# Patient Record
Sex: Female | Born: 1970 | Race: Black or African American | Hispanic: No | Marital: Single | Smoking: Current every day smoker
Health system: Southern US, Community
[De-identification: ages and names within clinical notes are randomized; demographics above are authoritative.]

## PROBLEM LIST (undated history)

## (undated) HISTORY — PX: OTHER SURGICAL HISTORY: SHX169

---

## 1997-09-18 ENCOUNTER — Ambulatory Visit (HOSPITAL_COMMUNITY): Admission: RE | Admit: 1997-09-18 | Discharge: 1997-09-18 | Payer: Self-pay | Admitting: Obstetrics & Gynecology

## 1997-12-07 ENCOUNTER — Ambulatory Visit (HOSPITAL_COMMUNITY): Admission: RE | Admit: 1997-12-07 | Discharge: 1997-12-07 | Payer: Self-pay | Admitting: Obstetrics

## 1998-01-15 ENCOUNTER — Encounter (HOSPITAL_COMMUNITY): Admission: RE | Admit: 1998-01-15 | Discharge: 1998-01-25 | Payer: Self-pay | Admitting: Obstetrics

## 1998-01-22 ENCOUNTER — Inpatient Hospital Stay (HOSPITAL_COMMUNITY): Admission: AD | Admit: 1998-01-22 | Discharge: 1998-01-26 | Payer: Self-pay | Admitting: *Deleted

## 1998-12-27 ENCOUNTER — Ambulatory Visit (HOSPITAL_COMMUNITY): Admission: RE | Admit: 1998-12-27 | Discharge: 1998-12-27 | Payer: Self-pay | Admitting: *Deleted

## 1998-12-28 ENCOUNTER — Ambulatory Visit (HOSPITAL_COMMUNITY): Admission: RE | Admit: 1998-12-28 | Discharge: 1998-12-28 | Payer: Self-pay | Admitting: *Deleted

## 1999-01-11 ENCOUNTER — Ambulatory Visit (HOSPITAL_COMMUNITY): Admission: RE | Admit: 1999-01-11 | Discharge: 1999-01-11 | Payer: Self-pay | Admitting: *Deleted

## 1999-01-25 ENCOUNTER — Encounter: Admission: RE | Admit: 1999-01-25 | Discharge: 1999-01-25 | Payer: Self-pay | Admitting: Pediatrics

## 1999-02-28 ENCOUNTER — Inpatient Hospital Stay (HOSPITAL_COMMUNITY): Admission: AD | Admit: 1999-02-28 | Discharge: 1999-02-28 | Payer: Self-pay | Admitting: *Deleted

## 1999-03-03 ENCOUNTER — Encounter (HOSPITAL_COMMUNITY): Admission: RE | Admit: 1999-03-03 | Discharge: 1999-03-04 | Payer: Self-pay | Admitting: Obstetrics & Gynecology

## 1999-03-03 ENCOUNTER — Encounter (INDEPENDENT_AMBULATORY_CARE_PROVIDER_SITE_OTHER): Payer: Self-pay

## 1999-03-03 ENCOUNTER — Inpatient Hospital Stay (HOSPITAL_COMMUNITY): Admission: AD | Admit: 1999-03-03 | Discharge: 1999-03-07 | Payer: Self-pay | Admitting: Obstetrics & Gynecology

## 1999-07-04 ENCOUNTER — Emergency Department (HOSPITAL_COMMUNITY): Admission: EM | Admit: 1999-07-04 | Discharge: 1999-07-05 | Payer: Self-pay | Admitting: Emergency Medicine

## 2000-12-31 ENCOUNTER — Encounter: Admission: RE | Admit: 2000-12-31 | Discharge: 2000-12-31 | Payer: Self-pay | Admitting: *Deleted

## 2004-11-14 ENCOUNTER — Emergency Department (HOSPITAL_COMMUNITY): Admission: EM | Admit: 2004-11-14 | Discharge: 2004-11-14 | Payer: Self-pay | Admitting: Emergency Medicine

## 2007-11-22 ENCOUNTER — Ambulatory Visit (HOSPITAL_COMMUNITY): Admission: RE | Admit: 2007-11-22 | Discharge: 2007-11-22 | Payer: Self-pay | Admitting: Obstetrics

## 2010-07-15 NOTE — Op Note (Signed)
[facility]  Patient:    Mandy Taylor                         MRN: [id number] Proc. Date: 03/05/99 Adm. Date:  [id number] Attending:  Antionette Char                           Operative Report  PREOPERATIVE DIAGNOSIS:       Desires sterilization.  POSTOPERATIVE DIAGNOSIS:      Desires sterilization.  OPERATION:                    Bilateral partial salpingectomy ([place] technique).  SURGEON:                      Charles A. Clearance Coots, M.D.  ASSISTANT:  ANESTHESIA:                   Epidural.  ESTIMATED BLOOD LOSS:         Negligible.  COMPLICATIONS:                None.  SPECIMEN:                     Approximately 2 cm segments of right and left fallopian tubes.  DESCRIPTION OF PROCEDURE:     The patient was brought to the operating room and  after satisfactory redosing of the epidural, the abdomen was prepped and draped in the usual sterile fashion.  A small inferior umbilical incision was made with the scalpel that was deepened down to the fascia with curved Mayo scissors bluntly.  The fascia was grasped in the midline with Kelly forceps and was cut transversely with curved Mayo scissors in between the [place] forceps.  The peritoneum was also cut.  The incision was extended to the left and to the right with the curved Mayo scissors.  Right angle retractors were placed in the incision and the right fallopian tube was identified and grasped with a Babcock clamp.  The tube was followed from the cornual end to the fimbrial end serially in the grasp of Babcock clamps.  The tube was then followed in a retrograde fashion back to the isthmic  area of the tube with the Babcock clamps and a knuckle of tube beneath the Bacock clamp and then the isthmic area of the tube was doubly ligated with #1 plain catgut and the section of tube above the knot was excised with Metzenbaum scissors and  submitted to pathology for evaluation.  There was  no active bleeding from the tubal stumps and they were therefore placed back in their normal anatomic position. he same procedure was performed on the opposite side without complications.  The abdomen was then closed as follows.  The peritoneum and fascia was closed as one with a continuous suture of 2-0 Vicryl.  The subcutaneous tissue was approximated with an interrupted suture of 2-0 Vicryl.  The skin was closed with a continuous subcuticular suture of 4-0 Monocryl.  A sterile bandage was applied to the incision closure.  The surgical technician indicated that all sponge, needle, and instrument counts were correct.  The patient tolerated the procedure well and was transported to the recovery room in satisfactory condition. DD:  03/05/99 TD:  03/05/99 Job: [id number]

## 2011-08-14 ENCOUNTER — Emergency Department (HOSPITAL_COMMUNITY)
Admission: EM | Admit: 2011-08-14 | Discharge: 2011-08-14 | Disposition: A | Discharge status: Home / Self Care | Payer: Self-pay | Attending: Emergency Medicine | Admitting: Emergency Medicine

## 2011-08-14 ENCOUNTER — Encounter (HOSPITAL_COMMUNITY): Payer: Self-pay | Admitting: Emergency Medicine

## 2011-08-14 DIAGNOSIS — F17200 Nicotine dependence, unspecified, uncomplicated: Secondary | ICD-10-CM | POA: Insufficient documentation

## 2011-08-14 DIAGNOSIS — R21 Rash and other nonspecific skin eruption: Secondary | ICD-10-CM

## 2011-08-14 DIAGNOSIS — L509 Urticaria, unspecified: Secondary | ICD-10-CM | POA: Insufficient documentation

## 2011-08-14 MED ORDER — HYDROCORTISONE 2.5 % EX LOTN: TOPICAL_LOTION | Freq: Two times a day (BID) | CUTANEOUS | Status: AC

## 2011-08-14 MED ORDER — HYDROXYZINE HCL 25 MG PO TABS: 25.0000 mg | ORAL_TABLET | Freq: Four times a day (QID) | ORAL | Status: AC

## 2011-08-14 NOTE — ED Provider Notes (Signed)
History     CSN: [id number]  Arrival date & time 08/14/11  1416   First MD Initiated Contact with Patient 08/14/11 1650      5:12 PM HPI F reports one week ago and stayed in a hotel room. States afterwards developed large hives on her back. States symptoms have been persistent. Reports hives or itchy and red. States significant other has been in the same bed but he does not have similar hives. Denies purulent drainage, change in detergents, soap, lotion, or known allergies.  Patient is a 41 y.o. female presenting with rash. The history is provided by the patient.  Rash  This is a new problem. The current episode started more than 1 week ago. The problem has been gradually worsening. The problem is associated with an unknown factor. There has been no fever. The rash is present on the back, torso, right lower leg, right upper leg, left upper leg and left lower leg. The pain is mild. The pain has been constant since onset. Associated symptoms include itching. She has tried antihistamines and anti-itch cream for the symptoms. The treatment provided no relief.    History reviewed. No pertinent past medical history.  Past Surgical History  Procedure Date  . Cesearean     No family history on file.  History  Substance Use Topics  . Smoking status: Current Everyday Smoker    Types: Cigarettes  . Smokeless tobacco: Not on file  . Alcohol Use: 0.6 oz/week    1 Cans of beer per week     every 2 days    OB History    Grav Para Term Preterm Abortions TAB SAB Ect Mult Living                  Review of Systems  Constitutional: Negative for fever and chills.  HENT: Negative for rhinorrhea.   Eyes: Negative for redness.  Respiratory: Negative for cough, shortness of breath and wheezing.   Cardiovascular: Negative for chest pain and palpitations.  Gastrointestinal: Negative for nausea.  Skin: Positive for itching and rash.       Pruritus  All other systems reviewed and are  negative.    Allergies  Review of patient's allergies indicates no known allergies.  Home Medications  No current outpatient prescriptions on file.  BP 109/79  Pulse 102  Temp 98.2 F (36.8 C) (Oral)  Resp 18  SpO2 100%  LMP 08/07/2011  Physical Exam  Vitals reviewed. Constitutional: She is oriented to person, place, and time. Vital signs are normal. She appears well-developed and well-nourished. No distress.  HENT:  Head: Normocephalic and atraumatic.  Eyes: Pupils are equal, round, and reactive to light.  Neck: Neck supple.  Pulmonary/Chest: Effort normal.  Neurological: She is alert and oriented to person, place, and time.  Skin: Skin is warm and dry. No rash noted. No erythema. No pallor.       Patient has a large urticaria on right upper extremity in right ankle. Areas on back in that appears to be similar with decreased erythema and appear to be excoriated. No fluctuance, or drainage.  Psychiatric: She has a normal mood and affect. Her behavior is normal.    ED Course  Procedures   MDM   Patient appears to have lesions typical for did drugs. However the story is inconsistent since the significant other does not have similar symptoms. Will prescribe patient prednisone cream and antihistamine. Advised followup with dermatology if no improvement after medication. Patient voices  understanding and is ready for discharge   Thomasene Lot, Cordelia Poche 08/14/11 1719

## 2011-08-14 NOTE — Discharge Instructions (Signed)
Rash A rash is a change in the color or texture of your skin. There are many different types of rashes. You may have other problems that accompany your rash. CAUSES   Infections.   Allergic reactions. This can include allergies to pets or foods.   Certain medicines.   Exposure to certain chemicals, soaps, or cosmetics.   Heat.   Exposure to poisonous plants.   Tumors, both cancerous and noncancerous.  SYMPTOMS   Redness.   Scaly skin.   Itchy skin.   Dry or cracked skin.   Bumps.   Blisters.   Pain.  DIAGNOSIS  Your caregiver may do a physical exam to determine what type of rash you have. A skin sample (biopsy) may be taken and examined under a microscope. TREATMENT  Treatment depends on the type of rash you have. Your caregiver may prescribe certain medicines. For serious conditions, you may need to see a skin doctor (dermatologist). HOME CARE INSTRUCTIONS   Avoid the substance that caused your rash.   Do not scratch your rash. This can cause infection.   You may take cool baths to help stop itching.   Only take over-the-counter or prescription medicines as directed by your caregiver.   Keep all follow-up appointments as directed by your caregiver.  SEEK IMMEDIATE MEDICAL CARE IF:  You have increasing pain, swelling, or redness.   You have a fever.   You have new or severe symptoms.   You have body aches, diarrhea, or vomiting.   Your rash is not better after 3 days.  MAKE SURE YOU:  Understand these instructions.   Will watch your condition.   Will get help right away if you are not doing well or get worse.  Document Released: 02/03/2002 Document Revised: 02/02/2011 Document Reviewed: 11/28/2010 Exit[employer] Patient Information 2012 [employer]. 

## 2011-08-14 NOTE — ED Notes (Signed)
Pt presenting to ed with c/o rash to extremities that started a week ago. Pt is alert and oriented at this time

## 2011-08-15 NOTE — ED Provider Notes (Signed)
Medical screening examination/treatment/procedure(s) were performed by non-physician practitioner and as supervising physician I was immediately available for consultation/collaboration.   Kregg Cihlar M Kaylise Blakeley, MD 08/15/11 2300 

## 2014-01-01 ENCOUNTER — Emergency Department (HOSPITAL_COMMUNITY): Payer: Self-pay

## 2014-01-01 ENCOUNTER — Emergency Department (HOSPITAL_COMMUNITY)
Admission: EM | Admit: 2014-01-01 | Discharge: 2014-01-01 | Disposition: A | Discharge status: Home / Self Care | Payer: Self-pay | Attending: Emergency Medicine | Admitting: Emergency Medicine

## 2014-01-01 ENCOUNTER — Encounter (HOSPITAL_COMMUNITY): Payer: Self-pay | Admitting: Emergency Medicine

## 2014-01-01 DIAGNOSIS — R52 Pain, unspecified: Secondary | ICD-10-CM

## 2014-01-01 DIAGNOSIS — Y93F2 Activity, caregiving, lifting: Secondary | ICD-10-CM | POA: Insufficient documentation

## 2014-01-01 DIAGNOSIS — Y9289 Other specified places as the place of occurrence of the external cause: Secondary | ICD-10-CM | POA: Insufficient documentation

## 2014-01-01 DIAGNOSIS — Z720 Tobacco use: Secondary | ICD-10-CM | POA: Insufficient documentation

## 2014-01-01 DIAGNOSIS — S6991XA Unspecified injury of right wrist, hand and finger(s), initial encounter: Secondary | ICD-10-CM | POA: Insufficient documentation

## 2014-01-01 DIAGNOSIS — X58XXXA Exposure to other specified factors, initial encounter: Secondary | ICD-10-CM | POA: Insufficient documentation

## 2014-01-01 MED ORDER — TRAMADOL HCL 50 MG PO TABS
50.0000 mg | ORAL_TABLET | Freq: Once | ORAL | Status: AC
  Administered 2014-01-01: 50 mg via ORAL
  Filled 2014-01-01: qty 1

## 2014-01-01 MED ORDER — TRAMADOL HCL 50 MG PO TABS: 50.0000 mg | ORAL_TABLET | Freq: Four times a day (QID) | ORAL | Status: DC | PRN
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
## 2014-01-01 NOTE — ED Notes (Signed)
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 No obvious deformity. Did not treat with OTC meds or ice

## 2014-01-01 NOTE — Progress Notes (Signed)
[facility] Stacy,  ° °Provided pt with a list of primary care resources and a [facility] [employer] Card application to help patient establish primary care.  °

## 2014-06-09 ENCOUNTER — Emergency Department (HOSPITAL_COMMUNITY): Payer: Self-pay

## 2014-06-09 ENCOUNTER — Encounter (HOSPITAL_COMMUNITY): Payer: Self-pay | Admitting: Emergency Medicine

## 2014-06-09 ENCOUNTER — Emergency Department (HOSPITAL_COMMUNITY)
Admission: EM | Admit: 2014-06-09 | Discharge: 2014-06-09 | Disposition: A | Discharge status: Home / Self Care | Payer: Self-pay | Attending: Emergency Medicine | Admitting: Emergency Medicine

## 2014-06-09 DIAGNOSIS — Y998 Other external cause status: Secondary | ICD-10-CM | POA: Insufficient documentation

## 2014-06-09 DIAGNOSIS — Y9389 Activity, other specified: Secondary | ICD-10-CM | POA: Insufficient documentation

## 2014-06-09 DIAGNOSIS — Y9289 Other specified places as the place of occurrence of the external cause: Secondary | ICD-10-CM | POA: Insufficient documentation

## 2014-06-09 DIAGNOSIS — Z720 Tobacco use: Secondary | ICD-10-CM | POA: Insufficient documentation

## 2014-06-09 DIAGNOSIS — T148XXA Other injury of unspecified body region, initial encounter: Secondary | ICD-10-CM

## 2014-06-09 DIAGNOSIS — X58XXXA Exposure to other specified factors, initial encounter: Secondary | ICD-10-CM | POA: Insufficient documentation

## 2014-06-09 DIAGNOSIS — S29012A Strain of muscle and tendon of back wall of thorax, initial encounter: Secondary | ICD-10-CM | POA: Insufficient documentation

## 2014-06-09 LAB — BASIC METABOLIC PANEL
Anion gap: 9 (ref 5–15)
BUN: 6 mg/dL (ref 6–23)
CALCIUM: 9.1 mg/dL (ref 8.4–10.5)
CHLORIDE: 104 mmol/L (ref 96–112)
CO2: 25 mmol/L (ref 19–32)
Creatinine, Ser: 0.79 mg/dL (ref 0.50–1.10)
GFR calc Af Amer: >90 mL/min (ref >90)
GFR calc non Af Amer: >90 mL/min (ref >90)
Glucose, Bld: 91 mg/dL (ref 70–99)
Potassium: 3.5 mmol/L (ref 3.5–5.1)
SODIUM: 138 mmol/L (ref 135–145)

## 2014-06-09 LAB — CBC WITH DIFFERENTIAL/PLATELET
Basophils Absolute: 0 10*3/uL (ref 0.0–0.1)
Basophils Relative: 0 % (ref 0–1)
Eosinophils Absolute: 0.1 10*3/uL (ref 0.0–0.7)
Eosinophils Relative: 1 % (ref 0–5)
HCT: 28.2 % — ABNORMAL LOW (ref 36.0–46.0)
Hemoglobin: 7.8 g/dL — ABNORMAL LOW (ref 12.0–15.0)
LYMPHS PCT: 20 % (ref 12–46)
Lymphs Abs: 1.4 10*3/uL (ref 0.7–4.0)
MCH: 17.5 pg — ABNORMAL LOW (ref 26.0–34.0)
MCHC: 27.7 g/dL — ABNORMAL LOW (ref 30.0–36.0)
MCV: 63.4 fL — AB (ref 78.0–100.0)
MONOS PCT: 10 % (ref 3–12)
Monocytes Absolute: 0.7 10*3/uL (ref 0.1–1.0)
Neutro Abs: 4.8 10*3/uL (ref 1.7–7.7)
Neutrophils Relative %: 69 % (ref 43–77)
PLATELETS: 325 10*3/uL (ref 150–400)
RBC: 4.45 MIL/uL (ref 3.87–5.11)
RDW: 17.7 % — ABNORMAL HIGH (ref 11.5–15.5)
WBC: 7 10*3/uL (ref 4.0–10.5)

## 2014-06-09 LAB — I-STAT TROPONIN, ED: Troponin i, poc: 0 ng/mL (ref 0.00–0.08)

## 2014-06-09 MED ORDER — IBUPROFEN 600 MG PO TABS: 600.0000 mg | ORAL_TABLET | Freq: Four times a day (QID) | ORAL | Status: DC | PRN

## 2014-06-09 MED ORDER — ORPHENADRINE CITRATE 30 MG/ML IJ SOLN
60.0000 mg | Freq: Two times a day (BID) | INTRAMUSCULAR | Status: DC
  Filled 2014-06-09 (×2): qty 2

## 2014-06-09 MED ORDER — CYCLOBENZAPRINE HCL 10 MG PO TABS: 10.0000 mg | ORAL_TABLET | Freq: Two times a day (BID) | ORAL | Status: DC | PRN

## 2014-06-09 MED ORDER — KETOROLAC TROMETHAMINE 60 MG/2ML IM SOLN
60.0000 mg | Freq: Once | INTRAMUSCULAR | Status: AC
  Administered 2014-06-09: 60 mg via INTRAMUSCULAR
  Filled 2014-06-09: qty 2

## 2014-06-09 MED ORDER — CYCLOBENZAPRINE HCL 10 MG PO TABS
5.0000 mg | ORAL_TABLET | Freq: Once | ORAL | Status: AC
  Administered 2014-06-09: 5 mg via ORAL
  Filled 2014-06-09: qty 1

## 2014-06-09 NOTE — ED Notes (Signed)
Pt c/o right sided CP, SOB and chills starting this am; pt unsure if had fever

## 2014-06-09 NOTE — ED Provider Notes (Signed)
CSN: [id number]     Arrival date & time 06/09/14  1406 History   First MD Initiated Contact with Patient 06/09/14 1721     Chief Complaint  Patient presents with  . Chest Pain  . Shortness of Breath     (Consider location/radiation/quality/duration/timing/severity/associated sxs/prior Treatment) HPI  This is a 44 yo female with no pertinent PMH, presenting with mid-lower paraspinal pain, after sleeping upright last night.  This started when she awoke, is located right mid-lower paraspinal area.  It is persistent.  It is throbbing.  No meds have been taken.  It radiates throughout the right paraspinal area.  Triage note indicated dyspnea, but Ms. Poblano states that she is not short of breath.  She just feels worse pain when she moves her trunk in any way, including breathing deeply.  Negative for fever, nausea, vomiting, diaphoresis, leg swelling, cough.  Past Medical History  Diagnosis Date  . NVD (normal vaginal delivery)     x2   Past Surgical History  Procedure Laterality Date  . Cesearean     Family History  Problem Relation Age of Onset  . Diabetes Mother   . Hypertension Mother    History  Substance Use Topics  . Smoking status: Current Every Day Smoker    Types: Cigarettes  . Smokeless tobacco: Not on file  . Alcohol Use: 0.6 oz/week    1 Cans of beer per week     Comment: every 2 days   OB History    No data available     Review of Systems  Constitutional: Negative for fever and chills.  HENT: Negative for facial swelling.   Eyes: Negative for photophobia and pain.  Respiratory: Negative for cough and shortness of breath.   Cardiovascular: Negative for chest pain and leg swelling.  Gastrointestinal: Negative for nausea, vomiting and abdominal pain.  Genitourinary: Negative for dysuria.  Musculoskeletal: Positive for back pain. Negative for arthralgias.  Skin: Negative for rash and wound.  Neurological: Negative for seizures.  Hematological: Negative for  adenopathy.      Allergies  Other  Home Medications   Prior to Admission medications   Medication Sig Start Date End Date Taking? Authorizing Provider  cyclobenzaprine (FLEXERIL) 10 MG tablet Take 1 tablet (10 mg total) by mouth 2 (two) times daily as needed for muscle spasms. 06/09/14   Loma Boston, MD  ibuprofen (ADVIL,MOTRIN) 600 MG tablet Take 1 tablet (600 mg total) by mouth every 6 (six) hours as needed. 06/09/14   Loma Boston, MD  traMADol (ULTRAM) 50 MG tablet Take 1 tablet (50 mg total) by mouth every 6 (six) hours as needed. Patient not taking: Reported on 06/09/2014 01/01/14   Junious Silk, PA-C   BP 106/65 mmHg  Pulse 77  Temp(Src) 98.5 F (36.9 C) (Oral)  Resp 20  SpO2 100%  LMP 05/28/2014 Physical Exam  Constitutional: She is oriented to person, place, and time. She appears well-developed and well-nourished. No distress.  HENT:  Head: Normocephalic and atraumatic.  Mouth/Throat: No oropharyngeal exudate.  Eyes: Conjunctivae are normal. Pupils are equal, round, and reactive to light. No scleral icterus.  Neck: Normal range of motion. No tracheal deviation present. No thyromegaly present.  Cardiovascular: Normal rate, regular rhythm and normal heart sounds.  Exam reveals no gallop and no friction rub.   No murmur heard. Pulmonary/Chest: Effort normal and breath sounds normal. No stridor. No respiratory distress. She has no wheezes. She has no rales. She exhibits no tenderness.  Abdominal: Soft.  She exhibits no distension and no mass. There is no tenderness. There is no rebound and no guarding.  Musculoskeletal: Normal range of motion. She exhibits tenderness (right paraspinal muscular TTP at around T10). She exhibits no edema.  Neurological: She is alert and oriented to person, place, and time.  Skin: Skin is warm and dry. She is not diaphoretic.    ED Course  Procedures (including critical care time) Labs Review Labs Reviewed  CBC WITH  DIFFERENTIAL/PLATELET - Abnormal; Notable for the following:    Hemoglobin 7.8 (*)    HCT 28.2 (*)    MCV 63.4 (*)    MCH 17.5 (*)    MCHC 27.7 (*)    RDW 17.7 (*)    All other components within normal limits  BASIC METABOLIC PANEL  I-STAT TROPOININ, ED    Imaging Review Dg Chest 2 View  06/09/2014   CLINICAL DATA:  Right-sided chest pain radiating into right upper extremity  EXAM: CHEST  2 VIEW  COMPARISON:  None.  FINDINGS: Lungs are clear. Heart size and pulmonary vascularity are normal. No adenopathy. No pneumothorax. No bone lesions.  IMPRESSION: No edema or consolidation.   Electronically Signed   By: Bretta BangWilliam  Woodruff III M.D.   On: 06/09/2014 15:16     EKG Interpretation   Date/Time:  Tuesday June 09 2014 14:16:22 EDT Ventricular Rate:  86 PR Interval:  118 QRS Duration: 84 QT Interval:  350 QTC Calculation: 418 R Axis:   70 Text Interpretation:  Normal sinus rhythm Normal ECG Confirmed by ZAVITZ   MD, JOSHUA ([id number]) on 06/09/2014 6:06:29 PM      MDM   Final diagnoses:  Muscle strain    This is a 44 yo female with no pertinent PMH, presenting with mid-lower paraspinal pain, after sleeping upright last night.  This started when she awoke, is located right mid-lower paraspinal area.  It is persistent.  It is throbbing.  No meds have been taken.  It radiates throughout the right paraspinal area.  Triage note indicated dyspnea, but Ms. Rodrigue states that she is not short of breath.  She just feels worse pain when she moves her trunk in any way, including breathing deeply.  Negative for fever, nausea, vomiting, diaphoresis, leg swelling, cough.  CV, resp, abdominal exams are WNL.  Positive for right-sided mid paraspinal TTP.  Negative for focal neuro deficits.  EKG, CXR are WNL, as is initial lab workup, including troponin.  History and physical exam are not consistent with ACS, PTX, PNA, dissection, PE, renal etiology such as pyelo or thrombosis, or GB etiology.  No evidence  of spinal fx, epidural abscess, disciitis, cauda equina.  Pain is resolved with toradol, muscle relaxant.  This is consistent with MSK pain.  Pt stable for discharge, with Rx for NSAID, muscle relaxant, FU c PCP.  All questions answered.  Return precautions given.  I have discussed case and care has been guided by my attending physician, Dr. Jodi MourningZavitz.  Loma BostonStirling Luara Faye, MD 06/10/14 1036  Blane OharaJoshua Zavitz, MD 06/12/14 [phone]

## 2014-06-09 NOTE — ED Notes (Signed)
Pin pad broken, pt willing to sign.

## 2014-06-09 NOTE — Discharge Instructions (Signed)

## 2017-09-13 ENCOUNTER — Encounter (HOSPITAL_COMMUNITY): Payer: Self-pay | Admitting: Emergency Medicine

## 2017-09-13 ENCOUNTER — Emergency Department (HOSPITAL_COMMUNITY)
Admission: EM | Admit: 2017-09-13 | Discharge: 2017-09-13 | Disposition: A | Discharge status: Home / Self Care | Payer: Self-pay | Attending: Emergency Medicine | Admitting: Emergency Medicine

## 2017-09-13 DIAGNOSIS — Z79899 Other long term (current) drug therapy: Secondary | ICD-10-CM | POA: Insufficient documentation

## 2017-09-13 DIAGNOSIS — D649 Anemia, unspecified: Secondary | ICD-10-CM | POA: Insufficient documentation

## 2017-09-13 DIAGNOSIS — R0602 Shortness of breath: Secondary | ICD-10-CM | POA: Insufficient documentation

## 2017-09-13 DIAGNOSIS — F17210 Nicotine dependence, cigarettes, uncomplicated: Secondary | ICD-10-CM | POA: Insufficient documentation

## 2017-09-13 LAB — COMPREHENSIVE METABOLIC PANEL
ALBUMIN: 3.6 g/dL (ref 3.5–5.0)
ALK PHOS: 51 U/L (ref 38–126)
ALT: 20 U/L (ref 0–44)
AST: 32 U/L (ref 15–41)
Anion gap: 9 (ref 5–15)
BILIRUBIN TOTAL: 0.7 mg/dL (ref 0.3–1.2)
BUN: 8 mg/dL (ref 6–20)
CALCIUM: 8.8 mg/dL — AB (ref 8.9–10.3)
CO2: 26 mmol/L (ref 22–32)
Chloride: 103 mmol/L (ref 98–111)
Creatinine, Ser: 0.71 mg/dL (ref 0.44–1.00)
GFR calc Af Amer: >60 mL/min (ref >60)
GFR calc non Af Amer: >60 mL/min (ref >60)
GLUCOSE: 92 mg/dL (ref 70–99)
Potassium: 3.7 mmol/L (ref 3.5–5.1)
Sodium: 138 mmol/L (ref 135–145)
Total Protein: 7.2 g/dL (ref 6.5–8.1)

## 2017-09-13 LAB — CBC
HCT: 26.7 % — ABNORMAL LOW (ref 36.0–46.0)
Hemoglobin: 7 g/dL — ABNORMAL LOW (ref 12.0–15.0)
MCH: 15.7 pg — ABNORMAL LOW (ref 26.0–34.0)
MCHC: 26.2 g/dL — ABNORMAL LOW (ref 30.0–36.0)
MCV: 60 fL — ABNORMAL LOW (ref 78.0–100.0)
Platelets: 395 10*3/uL (ref 150–400)
RBC: 4.45 MIL/uL (ref 3.87–5.11)
RDW: 19.1 % — AB (ref 11.5–15.5)
WBC: 6.3 10*3/uL (ref 4.0–10.5)

## 2017-09-13 LAB — RAPID URINE DRUG SCREEN, HOSP PERFORMED
AMPHETAMINES: NOT DETECTED
Benzodiazepines: NOT DETECTED
Cocaine: NOT DETECTED
Opiates: NOT DETECTED
TETRAHYDROCANNABINOL: POSITIVE — AB

## 2017-09-13 LAB — I-STAT BETA HCG BLOOD, ED (MC, WL, AP ONLY): I-stat hCG, quantitative: <5 m[IU]/mL (ref <5)

## 2017-09-13 LAB — TYPE AND SCREEN
ABO/RH(D): O POS
ANTIBODY SCREEN: NEGATIVE

## 2017-09-13 LAB — URINALYSIS, ROUTINE W REFLEX MICROSCOPIC
BILIRUBIN URINE: NEGATIVE
Glucose, UA: NEGATIVE mg/dL
Hgb urine dipstick: NEGATIVE
KETONES UR: 5 mg/dL — AB
Leukocytes, UA: NEGATIVE
Nitrite: NEGATIVE
PH: 5 (ref 5.0–8.0)
Protein, ur: NEGATIVE mg/dL
Specific Gravity, Urine: 1.021 (ref 1.005–1.030)

## 2017-09-13 LAB — LIPASE, BLOOD: Lipase: 46 U/L (ref 11–51)

## 2017-09-13 LAB — PROTIME-INR
INR: 1.06
PROTHROMBIN TIME: 13.7 s (ref 11.4–15.2)

## 2017-09-13 LAB — TROPONIN I: Troponin I: <0.03 ng/mL (ref <0.03)

## 2017-09-13 LAB — APTT: APTT: 27 s (ref 24–36)

## 2017-09-13 MED ORDER — FERROUS SULFATE 325 (65 FE) MG PO TABS: 325.0000 mg | ORAL_TABLET | Freq: Every day | ORAL | 0 refills | Status: AC

## 2017-09-13 NOTE — ED Triage Notes (Signed)
Pt c/o nausea, muscle cramps, dizziness and feeling like going to pass out for over year.

## 2017-09-13 NOTE — ED Provider Notes (Signed)
[facility]-EMERGENCY DEPT Provider Note   CSN: [id number] Arrival date & time: 09/13/17  1300     History   Chief Complaint Chief Complaint  Patient presents with  . Dizziness  . muscle cramps    HPI Mandy Taylor is a 47 y.o. female.  HPI   Pt is a 47 y/o female with a h/o anemia who presents to the ED today to be evaluated for multiple complaints. Pt states that for the last several years she has had episodes of lightheadedness and near syncope that occur about every other day. States that episodes occur randomly throughout the day. States that symptoms resolve after a period of sitting. Denies associated chest pain. She does report intermittent shortness of breath, palpitations, and clamminess with the episodes (none currently). No vision changes, numbness/weakness. States she has had one actual syncopal episode which happened 3-4 years ago. Currently pt reports that she she feels a bit woozy headed. Sxs are not particularly worse today or in the last few weeks.  She also reports muscle cramping that has been chronic but has seemed to worsen this year. States these symptoms usually happen daily but they have not occurred today. LMP was about 2-3 weeks ago. She ports a history of uterine fibroids and has very heavy menstrual cycles.  She does not currently follow with OB/GYN.  Pt smokes tobacco. Has never been diagnosed with HTN, HLD, DM.  Past Medical History:  Diagnosis Date  . NVD (normal vaginal delivery)    x2    There are no active problems to display for this patient.   Past Surgical History:  Procedure Laterality Date  . cesearean       OB History   None      Home Medications    Prior to Admission medications   Medication Sig Start Date End Date Taking? Authorizing Provider  Multiple Vitamins-Minerals (MULTIVITAMIN ADULT PO) Take 1 tablet by mouth daily.   Yes [provider]  ferrous sulfate 325 (65 FE) MG tablet Take 1  tablet (325 mg total) by mouth daily. 09/13/17 10/13/17  Jontavius Rabalais S, PA-C    Family History Family History  Problem Relation Age of Onset  . Diabetes Mother   . Hypertension Mother     Social History Social History   Tobacco Use  . Smoking status: Current Every Day Smoker    Types: Cigarettes  . Smokeless tobacco: Current User  Substance Use Topics  . Alcohol use: Yes    Alcohol/week: 8.4 oz    Types: 14 Cans of beer per week  . Drug use: No    Types: Marijuana     Allergies   Other   Review of Systems Review of Systems  Constitutional: Negative for chills and fever.  HENT: Negative for congestion, ear pain, rhinorrhea and sore throat.   Eyes: Negative for pain and visual disturbance.  Respiratory: Negative for cough and shortness of breath.   Cardiovascular: Negative for chest pain and palpitations.  Gastrointestinal: Positive for nausea. Negative for abdominal pain, constipation, diarrhea and vomiting.  Genitourinary: Negative for dysuria, flank pain, frequency, hematuria and urgency.  Musculoskeletal: Negative for back pain.  Skin: Negative for color change and rash.  Neurological: Positive for light-headedness. Negative for dizziness, weakness, numbness and headaches.  All other systems reviewed and are negative.  Physical Exam Updated Vital Signs BP 114/74 (BP Location: Right Arm)   Pulse 78   Temp 98.7 F (37.1 C) (Oral)   Resp  15   Ht 5\' 3"  (1.6 m)   Wt 70.3 kg (155 lb)   LMP 08/20/2017   SpO2 100%   BMI 27.46 kg/m   Physical Exam  Constitutional: She appears well-developed and well-nourished.  No acute distress  HENT:  Head: Normocephalic and atraumatic.  Mouth/Throat: Oropharynx is clear and moist.  Eyes: Pupils are equal, round, and reactive to light. Conjunctivae and EOM are normal.  No horizontal or vertical nystagmus  Neck: Neck supple.  Cardiovascular: Normal rate, regular rhythm, normal heart sounds and intact distal pulses.  No  murmur heard. Pulmonary/Chest: Effort normal and breath sounds normal. No stridor. No respiratory distress. She has no wheezes.  Abdominal: Soft. Bowel sounds are normal. She exhibits no distension. There is no tenderness. There is no guarding.  Musculoskeletal: She exhibits no edema.  Neurological: She is alert.  Mental Status:  Alert, thought content appropriate, able to give a coherent history. Speech fluent without evidence of aphasia. Able to follow 2 step commands without difficulty.  Cranial Nerves:  II:  pupils equal, round, reactive to light III,IV, VI: ptosis not present, extra-ocular motions intact bilaterally  V,VII: smile symmetric, facial light touch sensation equal VIII: hearing grossly normal to voice  X: uvula elevates symmetrically  XI: bilateral shoulder shrug symmetric and strong XII: midline tongue extension without fassiculations Motor:  Normal tone. 5/5 strength of BUE and BLE major muscle groups including strong and equal grip strength and dorsiflexion/plantar flexion Sensory: light touch normal in all extremities. Gait: normal gait and balance.  CV: 2+ radial and DP/PT pulses  Skin: Skin is warm and dry. Capillary refill takes less than 2 seconds.  Psychiatric: She has a normal mood and affect.  Nursing note and vitals reviewed.   ED Treatments / Results  Labs (all labs ordered are listed, but only abnormal results are displayed) Labs Reviewed  COMPREHENSIVE METABOLIC PANEL - Abnormal; Notable for the following components:      Result Value   Calcium 8.8 (*)    All other components within normal limits  CBC - Abnormal; Notable for the following components:   Hemoglobin 7.0 (*)    HCT 26.7 (*)    MCV 60.0 (*)    MCH 15.7 (*)    MCHC 26.2 (*)    RDW 19.1 (*)    All other components within normal limits  URINALYSIS, ROUTINE W REFLEX MICROSCOPIC - Abnormal; Notable for the following components:   Ketones, ur 5 (*)    All other components within normal  limits  RAPID URINE DRUG SCREEN, HOSP PERFORMED - Abnormal; Notable for the following components:   Tetrahydrocannabinol POSITIVE (*)    Barbiturates   (*)    Value: Result not available. Reagent lot number recalled by manufacturer.   All other components within normal limits  LIPASE, BLOOD  APTT  PROTIME-INR  TROPONIN I  I-STAT BETA HCG BLOOD, ED ([facility], [facility], [facility] ONLY)  TYPE AND SCREEN    EKG EKG Interpretation  Date/Time:  Thursday September 13 2017 13:08:43 EDT Ventricular Rate:  78 PR Interval:    QRS Duration: 92 QT Interval:  381 QTC Calculation: 434 R Axis:   71 Text Interpretation:  Sinus rhythm No significant change since last tracing Confirmed by Richardean Canal [id number]) on 09/13/2017 4:28:01 PM   Radiology No results found.  Procedures Procedures (including critical care time)  Medications Ordered in ED Medications - No data to display   Initial Impression / Assessment and Plan / ED Course  I have reviewed the triage vital signs and the nursing notes.  Pertinent labs & imaging results that were available during my care of the patient were reviewed by me and considered in my medical decision making (see chart for details).     Final Clinical Impressions(s) / ED Diagnoses   Final diagnoses:  Anemia, unspecified type   Pt presenting with chronic sxs of lightheadedness, near syncope and muscle cramping for several years. VSS. Pt is not orthostatic.  cbc with hgb of 7.0, prior hgb 3 years ago was in this range. Suspect that patient has chronically low hgb. CMP, lipase, tropinin, and coags all WNL. Negative beta hcg. UDS positive for marijuana. ECG is WNL with NSR. No significant change since last tracing.   Sxs most likely attributed to symptomatic anemia. Given the chronicity of patients symptoms and that she denies that her symptoms are acutely worse recently, feel that she is appropriate for outpatient management with iron supplementation and close f/u. Do not suspect  rhabdomyolysis. Gave pt resources for [employer] and wellness, OB-Gyn, as well as hematology. Advised her to make an appt for f/u and hemoglobin recheck in 1 week. Advised if she cannot get an appt with any of these specialists that she should return to the ED for hbg recheck. Advised strict return precaution for any new or worsening symptoms in the meantime. She understands the plan for f/u and reasons to return. All questions answered.  ED Discharge Orders        Ordered    ferrous sulfate 325 (65 FE) MG tablet  Daily     09/13/17 [address] [place], PA-C 09/13/17 1759    Charlynne Pander, MD 09/13/17 [place]

## 2017-09-13 NOTE — Discharge Instructions (Addendum)
You need to take the iron supplementation daily for the next 14 days. You [facility], when in the outpatient clinic, or hematology-oncology to have your hemoglobin rechecked in 1 week. If you have any new or worsening symptoms in the meantime you need to return to the emergency room immediately.

## 2017-09-14 LAB — ABO/RH: ABO/RH(D): O POS

## 2017-11-07 ENCOUNTER — Ambulatory Visit: Payer: Self-pay | Admitting: Family Medicine

## 2017-11-22 ENCOUNTER — Ambulatory Visit: Payer: Self-pay | Admitting: Family Medicine

## 2018-06-11 ENCOUNTER — Ambulatory Visit (HOSPITAL_COMMUNITY)
Admission: EM | Admit: 2018-06-11 | Discharge: 2018-06-11 | Disposition: A | Discharge status: Home / Self Care | Payer: Self-pay | Attending: Family Medicine | Admitting: Family Medicine

## 2018-06-11 ENCOUNTER — Encounter (HOSPITAL_COMMUNITY): Payer: Self-pay | Admitting: Emergency Medicine

## 2018-06-11 ENCOUNTER — Other Ambulatory Visit: Payer: Self-pay

## 2018-06-11 DIAGNOSIS — J069 Acute upper respiratory infection, unspecified: Secondary | ICD-10-CM

## 2018-06-11 NOTE — ED Provider Notes (Signed)
[facility]    CSN: [id number] Arrival date & time: 06/11/18  1041     History   Chief Complaint Chief Complaint  Patient presents with  . Nasal Congestion    HPI Shatonga Sciulli Russett is a 48 y.o. female.   HPI  Patient has had nasal congestion for 3 days.  Runny stuffy nose.  She had a little bit of coughing from the postnasal drip.  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  She is not allowed to come back to work until she is clear of her illness.  She is had no shortness of breath.  Does not feel she has any coughing from chest congestion.  She said no sweats no chills no fever.  She was debating whether she had allergies or cold.  She has taken some over-the-counter medicines with improvement.  No known exposure to COVID-19.  She is taking her temperature daily  Past Medical History:  Diagnosis Date  . NVD (normal vaginal delivery)    x2    There are no active problems to display for this patient.   Past Surgical History:  Procedure Laterality Date  . cesearean      OB History   No obstetric history on file.      Home Medications    Prior to Admission medications   Medication Sig Start Date End Date Taking? Authorizing Provider  ferrous sulfate 325 (65 FE) MG tablet Take 1 tablet (325 mg total) by mouth daily. 09/13/17 10/13/17  Couture, Cortni S, PA-C  Multiple Vitamins-Minerals (MULTIVITAMIN ADULT PO) Take 1 tablet by mouth daily.    [provider]    Family History Family History  Problem Relation Age of Onset  . Diabetes Mother   . Hypertension Mother     Social History Social History   Tobacco Use  . Smoking status: Current Every Day Smoker    Types: Cigarettes  . Smokeless tobacco: Current User  Substance Use Topics  . Alcohol use: Yes    Alcohol/week: 14.0 standard drinks    Types: 14 Cans of beer per week  . Drug use: No    Types: Marijuana     Allergies   Other   Review of Systems Review of Systems   Constitutional: Negative for chills and fever.  HENT: Positive for congestion, postnasal drip and rhinorrhea. Negative for ear pain and sore throat.   Eyes: Negative for pain and visual disturbance.  Respiratory: Negative for cough and shortness of breath.   Cardiovascular: Negative for chest pain and palpitations.  Gastrointestinal: Negative for abdominal pain and vomiting.  Genitourinary: Negative for dysuria and hematuria.  Musculoskeletal: Negative for arthralgias and back pain.  Skin: Negative for color change and rash.  Neurological: Negative for seizures and syncope.  All other systems reviewed and are negative.    Physical Exam Triage Vital Signs ED Triage Vitals [06/11/18 1100]  Enc Vitals Group     BP 132/67     Pulse Rate 96     Resp 18     Temp 98.1 F (36.7 C)     Temp Source Temporal     SpO2 100 %     Weight      Height      Head Circumference      Peak Flow      Pain Score 0     Pain Loc      Pain Edu?      Excl. in GC?  No data found.  Updated Vital Signs BP 132/67 (BP Location: Right Arm)   Pulse 96   Temp 98.1 F (36.7 C) (Temporal)   Resp 18   SpO2 100%   Visual Acuity Right Eye Distance:   Left Eye Distance:   Bilateral Distance:    Right Eye Near:   Left Eye Near:    Bilateral Near:     Physical Exam Constitutional:      General: She is not in acute distress.    Appearance: She is well-developed.  HENT:     Head: Normocephalic and atraumatic.     Right Ear: Tympanic membrane, ear canal and external ear normal.     Left Ear: Tympanic membrane, ear canal and external ear normal.     Nose: Congestion and rhinorrhea present.     Comments: Clear rhinorrhea    Mouth/Throat:     Mouth: Mucous membranes are moist.     Pharynx: No posterior oropharyngeal erythema.  Eyes:     Conjunctiva/sclera: Conjunctivae normal.     Pupils: Pupils are equal, round, and reactive to light.  Neck:     Musculoskeletal: Normal range of motion and  neck supple.  Cardiovascular:     Rate and Rhythm: Normal rate and regular rhythm.     Heart sounds: Normal heart sounds.  Pulmonary:     Effort: Pulmonary effort is normal. No respiratory distress.     Breath sounds: Normal breath sounds.  Abdominal:     General: There is no distension.     Palpations: Abdomen is soft.  Musculoskeletal: Normal range of motion.  Lymphadenopathy:     Cervical: No cervical adenopathy.  Skin:    General: Skin is warm and dry.  Neurological:     Mental Status: She is alert.  Psychiatric:        Mood and Affect: Mood normal.        Behavior: Behavior normal.      UC Treatments / Results  Labs (all labs ordered are listed, but only abnormal results are displayed) Labs Reviewed - No data to display  EKG None  Radiology No results found.  Procedures Procedures (including critical care time)  Medications Ordered in UC Medications - No data to display  Initial Impression / Assessment and Plan / UC Course  I have reviewed the triage vital signs and the nursing notes.  Pertinent labs & imaging results that were available during my care of the patient were reviewed by me and considered in my medical decision making (see chart for details).     In an abundance of caution I am asking her to stay out of work for 7 days from the onset of her illness.  Over-the-counter medicines are appropriate. Final Clinical Impressions(s) / UC Diagnoses   Final diagnoses:  URI, acute     Discharge Instructions     Fluids OTC cough and cold medicines   ED Prescriptions    None     Controlled Substance Prescriptions [place] Controlled Substance Registry consulted? Not Applicable   Eustace MooreNelson, Shreena Baines Sue, MD 06/11/18 1750

## 2018-06-11 NOTE — Discharge Instructions (Addendum)
Fluids OTC cough and cold medicines

## 2018-06-11 NOTE — ED Triage Notes (Signed)
Pt sts nasal congestion x 3 days; denies fever or cough

## 2018-11-23 ENCOUNTER — Other Ambulatory Visit: Payer: Self-pay

## 2018-11-23 ENCOUNTER — Emergency Department (HOSPITAL_COMMUNITY): Payer: Self-pay

## 2018-11-23 ENCOUNTER — Emergency Department (HOSPITAL_COMMUNITY)
Admission: EM | Admit: 2018-11-23 | Discharge: 2018-11-23 | Disposition: A | Discharge status: Home / Self Care | Payer: Self-pay | Attending: Emergency Medicine | Admitting: Emergency Medicine

## 2018-11-23 ENCOUNTER — Encounter (HOSPITAL_COMMUNITY): Payer: Self-pay | Admitting: Emergency Medicine

## 2018-11-23 DIAGNOSIS — K047 Periapical abscess without sinus: Secondary | ICD-10-CM | POA: Insufficient documentation

## 2018-11-23 DIAGNOSIS — F17210 Nicotine dependence, cigarettes, uncomplicated: Secondary | ICD-10-CM | POA: Insufficient documentation

## 2018-11-23 LAB — CBC WITH DIFFERENTIAL/PLATELET
Abs Immature Granulocytes: 0.03 10*3/uL (ref 0.00–0.07)
Basophils Absolute: 0 10*3/uL (ref 0.0–0.1)
Basophils Relative: 0 %
Eosinophils Absolute: 0.1 10*3/uL (ref 0.0–0.5)
Eosinophils Relative: 1 %
HCT: 35.1 % — ABNORMAL LOW (ref 36.0–46.0)
Hemoglobin: 9.6 g/dL — ABNORMAL LOW (ref 12.0–15.0)
Immature Granulocytes: 0 %
Lymphocytes Relative: 14 %
Lymphs Abs: 1.5 10*3/uL (ref 0.7–4.0)
MCH: 19.4 pg — ABNORMAL LOW (ref 26.0–34.0)
MCHC: 27.4 g/dL — ABNORMAL LOW (ref 30.0–36.0)
MCV: 71.1 fL — ABNORMAL LOW (ref 80.0–100.0)
Monocytes Absolute: 1 10*3/uL (ref 0.1–1.0)
Monocytes Relative: 10 %
Neutro Abs: 7.8 10*3/uL — ABNORMAL HIGH (ref 1.7–7.7)
Neutrophils Relative %: 75 %
Platelets: 360 10*3/uL (ref 150–400)
RBC: 4.94 MIL/uL (ref 3.87–5.11)
RDW: 19.7 % — ABNORMAL HIGH (ref 11.5–15.5)
WBC: 10.4 10*3/uL (ref 4.0–10.5)
nRBC: 0 % (ref 0.0–0.2)

## 2018-11-23 LAB — BASIC METABOLIC PANEL
Anion gap: 11 (ref 5–15)
BUN: 5 mg/dL — ABNORMAL LOW (ref 6–20)
CO2: 24 mmol/L (ref 22–32)
Calcium: 8.9 mg/dL (ref 8.9–10.3)
Chloride: 102 mmol/L (ref 98–111)
Creatinine, Ser: 0.6 mg/dL (ref 0.44–1.00)
GFR calc Af Amer: >60 mL/min (ref >60)
GFR calc non Af Amer: >60 mL/min (ref >60)
Glucose, Bld: 87 mg/dL (ref 70–99)
Potassium: 3.7 mmol/L (ref 3.5–5.1)
Sodium: 137 mmol/L (ref 135–145)

## 2018-11-23 MED ORDER — SODIUM CHLORIDE (PF) 0.9 % IJ SOLN
INTRAMUSCULAR | Status: AC
  Filled 2018-11-23: qty 50

## 2018-11-23 MED ORDER — HYDROCODONE-ACETAMINOPHEN 5-325 MG PO TABS: 1.0000 | ORAL_TABLET | ORAL | 0 refills | Status: DC | PRN

## 2018-11-23 MED ORDER — DEXAMETHASONE SODIUM PHOSPHATE 10 MG/ML IJ SOLN
10.0000 mg | Freq: Once | INTRAMUSCULAR | Status: AC
  Administered 2018-11-23: 10 mg via INTRAVENOUS
  Filled 2018-11-23: qty 1

## 2018-11-23 MED ORDER — IBUPROFEN 800 MG PO TABS: 800.0000 mg | ORAL_TABLET | Freq: Three times a day (TID) | ORAL | 0 refills | Status: DC

## 2018-11-23 MED ORDER — IOHEXOL 300 MG/ML  SOLN
75.0000 mL | Freq: Once | INTRAMUSCULAR | Status: AC | PRN
  Administered 2018-11-23: 15:00:00 75 mL via INTRAVENOUS

## 2018-11-23 MED ORDER — CLINDAMYCIN HCL 300 MG PO CAPS: 300.0000 mg | ORAL_CAPSULE | Freq: Three times a day (TID) | ORAL | 0 refills | Status: DC

## 2018-11-23 MED ORDER — CLINDAMYCIN PHOSPHATE 600 MG/50ML IV SOLN
600.0000 mg | Freq: Once | INTRAVENOUS | Status: AC
  Administered 2018-11-23: 600 mg via INTRAVENOUS
  Filled 2018-11-23: qty 50

## 2018-11-23 NOTE — ED Triage Notes (Signed)
Pt woke this morning with L facial pain and edema since this morning. Pt states she is unable to eat, but was able to tolerate small amounts of broth. Pt denies injury to face, denies any known dental problems / infection.

## 2018-11-23 NOTE — Discharge Instructions (Signed)
Take motrin for pain   Take vicodin for severe pain. Do NOT drive with it   Take clindamycin three times daily for a week   Call dentist for follow up   Return to ER if you have worse swelling, trouble swallowing, fever.

## 2018-11-23 NOTE — ED Provider Notes (Signed)
[facility]-EMERGENCY DEPT Provider Note   CSN: [id number] Arrival date & time: 11/23/18  3382     History   Chief Complaint Chief Complaint  Patient presents with  . Facial Swelling  . Facial Pain    HPI Mandy Taylor is a 48 y.o. female.     Patient is a 48 year old female who presents with left-sided facial swelling.  She states that she woke up this morning with left-sided pain and edema in her cheek.  She is unable to open her mouth significantly.  She feels like it is a little swollen in the back of her throat and it is little hard to swallow.  She denies any known injury.  No known bites to the face.  She says the back of her left lower gum is a little sore but denies any known dental problems.  No known fevers.     Past Medical History:  Diagnosis Date  . NVD (normal vaginal delivery)    x2    There are no active problems to display for this patient.   Past Surgical History:  Procedure Laterality Date  . cesearean       OB History   No obstetric history on file.      Home Medications    Prior to Admission medications   Medication Sig Start Date End Date Taking? Authorizing Provider  ferrous sulfate 325 (65 FE) MG tablet Take 1 tablet (325 mg total) by mouth daily. Patient not taking: Reported on 11/23/2018 09/13/17 10/13/17  Couture, Cortni S, PA-C    Family History Family History  Problem Relation Age of Onset  . Diabetes Mother   . Hypertension Mother     Social History Social History   Tobacco Use  . Smoking status: Current Every Day Smoker    Types: Cigarettes  . Smokeless tobacco: Current User  Substance Use Topics  . Alcohol use: Yes    Alcohol/week: 14.0 standard drinks    Types: 14 Cans of beer per week  . Drug use: No    Types: Marijuana     Allergies   Other   Review of Systems Review of Systems  Constitutional: Negative for chills, diaphoresis, fatigue and fever.  HENT: Positive for facial swelling  and trouble swallowing. Negative for congestion, rhinorrhea and sneezing.   Eyes: Negative.   Respiratory: Negative for cough, chest tightness and shortness of breath.   Cardiovascular: Negative for chest pain and leg swelling.  Gastrointestinal: Negative for abdominal pain, blood in stool, diarrhea, nausea and vomiting.  Genitourinary: Negative for difficulty urinating, flank pain, frequency and hematuria.  Musculoskeletal: Negative for arthralgias and back pain.  Skin: Negative for rash.  Neurological: Negative for dizziness, speech difficulty, weakness, numbness and headaches.     Physical Exam Updated Vital Signs BP 112/71 (BP Location: Left Arm)   Pulse 83   Temp 98.4 F (36.9 C) (Oral)   Resp 18   LMP 11/01/2018   SpO2 100%   Physical Exam Constitutional:      Appearance: She is well-developed.  HENT:     Head: Normocephalic and atraumatic.     Mouth/Throat:     Comments: Positive swelling to the left cheek extending down into the left jaw area.  There is some left-sided cervical adenopathy present.  She has positive trismus present.  There is no elevation of the tongue.  There is mild swelling in the submandibular area.  I do not appreciate any discrete dental abscess. Eyes:  Pupils: Pupils are equal, round, and reactive to light.  Neck:     Musculoskeletal: Normal range of motion and neck supple.  Cardiovascular:     Rate and Rhythm: Normal rate and regular rhythm.     Heart sounds: Normal heart sounds.  Pulmonary:     Effort: Pulmonary effort is normal. No respiratory distress.     Breath sounds: Normal breath sounds. No wheezing or rales.  Chest:     Chest wall: No tenderness.  Abdominal:     General: Bowel sounds are normal.     Palpations: Abdomen is soft.     Tenderness: There is no abdominal tenderness. There is no guarding or rebound.  Musculoskeletal: Normal range of motion.  Lymphadenopathy:     Cervical: No cervical adenopathy.  Skin:    General:  Skin is warm and dry.     Findings: No rash.  Neurological:     Mental Status: She is alert and oriented to person, place, and time.      ED Treatments / Results  Labs (all labs ordered are listed, but only abnormal results are displayed) Labs Reviewed  BASIC METABOLIC PANEL - Abnormal; Notable for the following components:      Result Value   BUN 5 (*)    All other components within normal limits  CBC WITH DIFFERENTIAL/PLATELET - Abnormal; Notable for the following components:   Hemoglobin 9.6 (*)    HCT 35.1 (*)    MCV 71.1 (*)    MCH 19.4 (*)    MCHC 27.4 (*)    RDW 19.7 (*)    Neutro Abs 7.8 (*)    All other components within normal limits    EKG None  Radiology No results found.  Procedures Procedures (including critical care time)  Medications Ordered in ED Medications  sodium chloride (PF) 0.9 % injection (has no administration in time range)  dexamethasone (DECADRON) injection 10 mg (10 mg Intravenous Given 11/23/18 1249)  clindamycin (CLEOCIN) IVPB 600 mg (0 mg Intravenous Stopped 11/23/18 1324)  iohexol (OMNIPAQUE) 300 MG/ML solution 75 mL (75 mLs Intravenous Contrast Given 11/23/18 1433)     Initial Impression / Assessment and Plan / ED Course  I have reviewed the triage vital signs and the nursing notes.  Pertinent labs & imaging results that were available during my care of the patient were reviewed by me and considered in my medical decision making (see chart for details).        PT with left side facial swelling/trismus.  Awaiting CT scan.  Dr. Darl Householder to take over care.  Final Clinical Impressions(s) / ED Diagnoses   Final diagnoses:  None    ED Discharge Orders    None       Malvin Johns, MD 11/23/18 1450

## 2018-11-23 NOTE — ED Provider Notes (Signed)
  Physical Exam  BP 120/72 (BP Location: Left Arm)   Pulse 83   Temp 99.3 F (37.4 C) (Oral)   Resp 16   LMP 11/01/2018   SpO2 100%   Physical Exam  ED Course/Procedures     Procedures  MDM  Patient care assumed at 3 pm. Patient has L facial swelling. Able to tolerate secretions. Sign out pending CT maxillofacial.   3:41 PM  CT showed L cheek inflammation likely from tooth infection. No obvious deep space abscess. Given IV abx and steroids. Will dc home with clindamycin, dental follow up.     Drenda Freeze, MD 11/23/18 [phone]

## 2019-02-28 ENCOUNTER — Emergency Department (HOSPITAL_COMMUNITY)
Admission: EM | Admit: 2019-02-28 | Discharge: 2019-02-28 | Disposition: A | Discharge status: Home / Self Care | Payer: No Typology Code available for payment source | Attending: Emergency Medicine | Admitting: Emergency Medicine

## 2019-02-28 ENCOUNTER — Other Ambulatory Visit: Payer: Self-pay

## 2019-02-28 ENCOUNTER — Encounter (HOSPITAL_COMMUNITY): Payer: Self-pay

## 2019-02-28 ENCOUNTER — Emergency Department (HOSPITAL_COMMUNITY): Payer: No Typology Code available for payment source

## 2019-02-28 DIAGNOSIS — M79601 Pain in right arm: Secondary | ICD-10-CM | POA: Insufficient documentation

## 2019-02-28 DIAGNOSIS — M545 Low back pain: Secondary | ICD-10-CM | POA: Diagnosis present

## 2019-02-28 DIAGNOSIS — M51369 Other intervertebral disc degeneration, lumbar region without mention of lumbar back pain or lower extremity pain: Secondary | ICD-10-CM

## 2019-02-28 DIAGNOSIS — F1210 Cannabis abuse, uncomplicated: Secondary | ICD-10-CM | POA: Diagnosis not present

## 2019-02-28 DIAGNOSIS — F17210 Nicotine dependence, cigarettes, uncomplicated: Secondary | ICD-10-CM | POA: Diagnosis not present

## 2019-02-28 DIAGNOSIS — Y92410 Unspecified street and highway as the place of occurrence of the external cause: Secondary | ICD-10-CM | POA: Diagnosis not present

## 2019-02-28 DIAGNOSIS — Y998 Other external cause status: Secondary | ICD-10-CM | POA: Insufficient documentation

## 2019-02-28 DIAGNOSIS — M5136 Other intervertebral disc degeneration, lumbar region: Secondary | ICD-10-CM

## 2019-02-28 DIAGNOSIS — Y9389 Activity, other specified: Secondary | ICD-10-CM | POA: Diagnosis not present

## 2019-02-28 LAB — POC URINE PREG, ED: Preg Test, Ur: NEGATIVE

## 2019-02-28 MED ORDER — CYCLOBENZAPRINE HCL 10 MG PO TABS: 5.0000 mg | ORAL_TABLET | Freq: Two times a day (BID) | ORAL | 0 refills | Status: AC | PRN

## 2019-02-28 MED ORDER — MELOXICAM 15 MG PO TABS: 15.0000 mg | ORAL_TABLET | Freq: Every day | ORAL | 0 refills | Status: AC

## 2019-02-28 NOTE — Discharge Instructions (Signed)
Your imaging was negative for any acute abnormality.  You do appear to have a little bit of degenerative changes in the lower back which is common with age.  There is no evidence of any other fracture, dislocation.  He will be discharged with pain medication and muscle relaxers.  Return to the emergency department for the reasons listed below  Return to the emergency department immediately if you develop any of the following symptoms: You have numbness, tingling, or weakness in the arms or legs. You develop severe headaches not relieved with medicine. You have severe neck pain, especially tenderness in the middle of the back of your neck. You have changes in bowel or bladder control. There is increasing pain in any area of the body. You have shortness of breath, light-headedness, dizziness, or fainting. You have chest pain. You feel sick to your stomach (nauseous), throw up (vomit), or sweat. You have increasing abdominal discomfort. There is blood in your urine, stool, or vomit. You have pain in your shoulder (shoulder strap areas). You feel your symptoms are getting worse.
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
## 2021-04-01 ENCOUNTER — Encounter (HOSPITAL_BASED_OUTPATIENT_CLINIC_OR_DEPARTMENT_OTHER): Payer: Self-pay | Admitting: *Deleted

## 2021-04-01 ENCOUNTER — Other Ambulatory Visit: Payer: Self-pay

## 2021-04-01 ENCOUNTER — Emergency Department (HOSPITAL_BASED_OUTPATIENT_CLINIC_OR_DEPARTMENT_OTHER)
Admission: EM | Admit: 2021-04-01 | Discharge: 2021-04-01 | Disposition: A | Discharge status: Home / Self Care | Payer: Self-pay | Attending: Emergency Medicine | Admitting: Emergency Medicine

## 2021-04-01 DIAGNOSIS — K047 Periapical abscess without sinus: Secondary | ICD-10-CM | POA: Insufficient documentation

## 2021-04-01 DIAGNOSIS — R519 Headache, unspecified: Secondary | ICD-10-CM | POA: Insufficient documentation

## 2021-04-01 MED ORDER — AMOXICILLIN-POT CLAVULANATE 875-125 MG PO TABS
1.0000 | ORAL_TABLET | Freq: Once | ORAL | Status: AC
  Administered 2021-04-01: 1 via ORAL
  Filled 2021-04-01: qty 1

## 2021-04-01 MED ORDER — IBUPROFEN 200 MG PO TABS
600.0000 mg | ORAL_TABLET | Freq: Once | ORAL | Status: AC
  Administered 2021-04-01: 600 mg via ORAL
  Filled 2021-04-01: qty 1

## 2021-04-01 MED ORDER — OXYCODONE-ACETAMINOPHEN 5-325 MG PO TABS
1.0000 | ORAL_TABLET | Freq: Once | ORAL | Status: AC
  Administered 2021-04-01: 1 via ORAL
  Filled 2021-04-01: qty 1

## 2021-04-01 MED ORDER — AMOXICILLIN-POT CLAVULANATE 875-125 MG PO TABS: 1.0000 | ORAL_TABLET | Freq: Two times a day (BID) | ORAL | 0 refills | Status: AC

## 2021-04-01 NOTE — ED Notes (Signed)
Patient verbalizes understanding of discharge instructions. Opportunity for questioning and answers were provided. Armband removed by staff, pt discharged from ED. Ambulated out to lobby  

## 2021-04-01 NOTE — Discharge Instructions (Signed)
You have a dental infection. Please take entire course of antibiotics as directed.  Continue using ibuprofen / Tylenol for pain.  You will need to follow-up with your dentist for continued management of this. Please see dental resources below. Return to the emergency department for fevers, swelling or pain under the tongue or in the neck, difficulty breathing or swallowing, nausea or vomiting that does not stop, or any other new or concerning symptoms.

## 2021-04-01 NOTE — ED Triage Notes (Signed)
Dental pain and right facial swelling since yesterday.

## 2021-04-02 NOTE — ED Provider Notes (Signed)
[facility] EMERGENCY DEPARTMENT Provider Note   CSN: [id number] Arrival date & time: 04/01/21  1902     History  Chief Complaint  Patient presents with   Dental Pain    Mandy Taylor is a 51 y.o. female  who presents with concern for right-sided facial pain x2 days with associated swelling.  History of extracted tooth on that side and infection in the past.  No associated fevers, nausea, vomiting, difficulty swallowing.  Has used Tylenol at home with minimal relief.  I personally reviewed this patient's medical records.  She does not carry medical diagnoses nor she had any medications daily.  HPI     Home Medications Prior to Admission medications   Medication Sig Start Date End Date Taking? Authorizing Provider  amoxicillin-clavulanate (AUGMENTIN) 875-125 MG tablet Take 1 tablet by mouth every 12 (twelve) hours. 04/01/21  Yes Jock Mahon R, PA-C  cyclobenzaprine (FLEXERIL) 10 MG tablet Take 0.5-1 tablets (5-10 mg total) by mouth 2 (two) times daily as needed for muscle spasms. 02/28/19   Arthor Captain, PA-C  ferrous sulfate 325 (65 FE) MG tablet Take 1 tablet (325 mg total) by mouth daily. Patient not taking: Reported on 11/23/2018 09/13/17 10/13/17  Couture, Cortni S, PA-C  HYDROcodone-acetaminophen (NORCO/VICODIN) 5-325 MG tablet Take 1 tablet by mouth every 4 (four) hours as needed. 11/23/18   Charlynne Pander, MD  ibuprofen (ADVIL) 800 MG tablet Take 1 tablet (800 mg total) by mouth 3 (three) times daily. 11/23/18   Charlynne Pander, MD  meloxicam (MOBIC) 15 MG tablet Take 1 tablet (15 mg total) by mouth daily. Take 1 daily with food. 02/28/19   Arthor Captain, PA-C      Allergies    Other    Review of Systems   Review of Systems  Constitutional: Negative.   HENT:  Positive for dental problem. Negative for trouble swallowing.   Eyes: Negative.   Respiratory: Negative.    Cardiovascular: Negative.   Gastrointestinal: Negative.   Genitourinary: Negative.    Musculoskeletal: Negative.   Neurological: Negative.   Hematological: Negative.    Physical Exam Updated Vital Signs BP 117/70    Pulse (!) 113    Temp 98.8 F (37.1 C) (Oral)    Resp 18    Ht 5\' 3"  (1.6 m)    Wt 70.9 kg    SpO2 100%    BMI 27.69 kg/m  Physical Exam Vitals and nursing note reviewed.  Constitutional:      Appearance: She is not ill-appearing or toxic-appearing.  HENT:     Head: Normocephalic and atraumatic.      Nose: Nose normal.     Mouth/Throat:     Mouth: Mucous membranes are moist.     Dentition: Abnormal dentition. Dental tenderness, gingival swelling and dental caries present.     Pharynx: Oropharynx is clear. Uvula midline. No oropharyngeal exudate.     Tonsils: No tonsillar exudate.      Comments: No sublingual or submental tenderness palpation.  No anterior neck crepitus or swelling.  Patient tolerating her secretions well.  Eyes:     General: No scleral icterus.       Right eye: No discharge.        Left eye: No discharge.     Extraocular Movements: Extraocular movements intact.     Conjunctiva/sclera: Conjunctivae normal.     Pupils: Pupils are equal, round, and reactive to light.  Neck:     Trachea: Trachea and phonation normal.  Cardiovascular:     Heart sounds: Normal heart sounds. No murmur heard. Pulmonary:     Effort: Pulmonary effort is normal.     Breath sounds: Normal breath sounds.  Abdominal:     Palpations: Abdomen is soft.  Musculoskeletal:     Cervical back: Normal range of motion. No crepitus. No pain with movement.  Lymphadenopathy:     Cervical: No cervical adenopathy.  Skin:    General: Skin is warm and dry.     Capillary Refill: Capillary refill takes less than 2 seconds.  Neurological:     General: No focal deficit present.     Mental Status: She is alert.  Psychiatric:        Mood and Affect: Mood normal.    ED Results / Procedures / Treatments   Labs (all labs ordered are listed, but only abnormal results  are displayed) Labs Reviewed - No data to display  EKG None  Radiology No results found.  Procedures Procedures   Medications Ordered in ED Medications  amoxicillin-clavulanate (AUGMENTIN) 875-125 MG per tablet 1 tablet (1 tablet Oral Given 04/01/21 2257)  oxyCODONE-acetaminophen (PERCOCET/ROXICET) 5-325 MG per tablet 1 tablet (1 tablet Oral Given 04/01/21 2257)  ibuprofen (ADVIL) tablet 600 mg (600 mg Oral Given 04/01/21 2257)    ED Course/ Medical Decision Making/ A&P                           Medical Decision Making 51 year old female with facial swelling and dental pain.  Tachycardic on intake, vital signs otherwise normal.  Cardiopulmonary and abdominal exams are benign.  Patient with evidence of dental infection on the right mandible with associated facial swelling without abscess amenable to drainage. Pharyngeal exam is unremarkable without pharyngeal abscess.  Neck exam is unremarkable.  Patient was of antibiotics and analgesia will be administered in the emergency department.  Will discharge with oral antibiotics and close outpatient dental follow-up.  No further work-up warranted near this time given reassuring physical exam and vital signs. Mandy Taylor voiced understanding of her medical evaluation and treatment plan.  Each of her questions was answered to her expressed inspection.  Return precautions are given.  Patient is well-appearing, stable, and was appropriate for discharge at this time.  Risk Prescription drug management.   This chart was dictated using voice recognition software, Dragon. Despite the best efforts of this provider to proofread and correct errors, errors may still occur which can change documentation meaning.  Final Clinical Impression(s) / ED Diagnoses Final diagnoses:  Dental infection    Rx / DC Orders ED Discharge Orders          Ordered    amoxicillin-clavulanate (AUGMENTIN) 875-125 MG tablet  Every 12 hours        04/01/21 2331               Virl Coble, Eugene Gavia, PA-C 04/02/21 0006    Rozelle Logan, DO 04/04/21 1539

## 2023-06-02 ENCOUNTER — Other Ambulatory Visit: Payer: Self-pay

## 2023-06-02 ENCOUNTER — Emergency Department (HOSPITAL_BASED_OUTPATIENT_CLINIC_OR_DEPARTMENT_OTHER)
Admission: EM | Admit: 2023-06-02 | Discharge: 2023-06-02 | Disposition: A | Discharge status: Home / Self Care | Attending: Emergency Medicine | Admitting: Emergency Medicine

## 2023-06-02 ENCOUNTER — Encounter (HOSPITAL_BASED_OUTPATIENT_CLINIC_OR_DEPARTMENT_OTHER): Payer: Self-pay | Admitting: Emergency Medicine

## 2023-06-02 DIAGNOSIS — R220 Localized swelling, mass and lump, head: Secondary | ICD-10-CM | POA: Diagnosis present

## 2023-06-02 DIAGNOSIS — B029 Zoster without complications: Secondary | ICD-10-CM | POA: Insufficient documentation

## 2023-06-02 MED ORDER — HYDROCODONE-ACETAMINOPHEN 5-325 MG PO TABS: 1.0000 | ORAL_TABLET | ORAL | 0 refills | Status: AC | PRN

## 2023-06-02 MED ORDER — ACYCLOVIR 200 MG PO CAPS
800.0000 mg | ORAL_CAPSULE | Freq: Once | ORAL | Status: AC
  Administered 2023-06-02: 800 mg via ORAL
  Filled 2023-06-02: qty 4

## 2023-06-02 MED ORDER — HYDROCODONE-ACETAMINOPHEN 5-325 MG PO TABS
1.0000 | ORAL_TABLET | Freq: Once | ORAL | Status: AC
  Administered 2023-06-02: 1 via ORAL
  Filled 2023-06-02: qty 1

## 2023-06-02 MED ORDER — IBUPROFEN 400 MG PO TABS
600.0000 mg | ORAL_TABLET | Freq: Once | ORAL | Status: AC
  Administered 2023-06-02: 600 mg via ORAL
  Filled 2023-06-02: qty 1

## 2023-06-02 MED ORDER — ACYCLOVIR 800 MG PO TABS: 800.0000 mg | ORAL_TABLET | Freq: Every day | ORAL | 0 refills | Status: AC

## 2023-06-02 MED ORDER — IBUPROFEN 600 MG PO TABS: 600.0000 mg | ORAL_TABLET | Freq: Four times a day (QID) | ORAL | 0 refills | Status: AC | PRN

## 2023-06-02 NOTE — ED Triage Notes (Signed)
 Pt c/o swelling and pain to RT eye, worsening since Wed

## 2023-06-02 NOTE — Discharge Instructions (Signed)
 Take the medications as prescribed. Follow up with your doctor for recheck early next week.

## 2023-06-02 NOTE — ED Provider Notes (Signed)
 [facility] EMERGENCY DEPARTMENT AT [facility] Provider Note   CSN: [id number] Arrival date & time: 06/02/23  1829     History  Chief Complaint  Patient presents with   Facial Swelling    Mandy Taylor is a 53 y.o. female.  Patient to ED for evaluation of facial swelling that started as a pustule on the right forehead 3 days ago. Since has developed a painful rash over the right forehead, right scalp and right eyelid. No drainage, fever, vomiting. No eye pain with movement. No visual change.   The history is provided by the patient. No language interpreter was used.       Home Medications Prior to Admission medications   Medication Sig Start Date End Date Taking? Authorizing Provider  acyclovir (ZOVIRAX) 800 MG tablet Take 1 tablet (800 mg total) by mouth 5 (five) times daily. 06/02/23  Yes Elpidio Anis, PA-C  HYDROcodone-acetaminophen (NORCO/VICODIN) 5-325 MG tablet Take 1 tablet by mouth every 4 (four) hours as needed for severe pain (pain score 7-10). 06/02/23  Yes McGill Carmack, PA-C  ibuprofen (ADVIL) 600 MG tablet Take 1 tablet (600 mg total) by mouth every 6 (six) hours as needed. 06/02/23  Yes Elpidio Anis, PA-C  amoxicillin-clavulanate (AUGMENTIN) 875-125 MG tablet Take 1 tablet by mouth every 12 (twelve) hours. 04/01/21   Sponseller, Lupe Carney R, PA-C  cyclobenzaprine (FLEXERIL) 10 MG tablet Take 0.5-1 tablets (5-10 mg total) by mouth 2 (two) times daily as needed for muscle spasms. 02/28/19   Arthor Captain, PA-C  ferrous sulfate 325 (65 FE) MG tablet Take 1 tablet (325 mg total) by mouth daily. Patient not taking: Reported on 11/23/2018 09/13/17 10/13/17  Couture, Cortni S, PA-C  meloxicam (MOBIC) 15 MG tablet Take 1 tablet (15 mg total) by mouth daily. Take 1 daily with food. 02/28/19   Arthor Captain, PA-C      Allergies    Other    Review of Systems   Review of Systems  Physical Exam Updated Vital Signs BP 117/87 (BP Location: Right Arm)   Pulse 94   Temp  98.3 F (36.8 C)   Resp 18   Ht 5\' 3"  (1.6 m)   Wt 75.3 kg   SpO2 99%   BMI 29.41 kg/m  Physical Exam Vitals and nursing note reviewed.  Constitutional:      Appearance: Normal appearance.  Eyes:     Extraocular Movements: Extraocular movements intact.     Pupils: Pupils are equal, round, and reactive to light.     Comments: Pain free ROM of eyes bilaterally.  Cardiovascular:     Rate and Rhythm: Normal rate.  Pulmonary:     Effort: Pulmonary effort is normal.  Skin:    Comments: Coalesced raised rash extending from right forehead to right eyebrow.   Neurological:     Mental Status: She is alert and oriented to person, place, and time.     Sensory: No sensory deficit.     ED Results / Procedures / Treatments   Labs (all labs ordered are listed, but only abnormal results are displayed) Labs Reviewed - No data to display  EKG None  Radiology No results found.  Procedures Procedures    Medications Ordered in ED Medications  acyclovir (ZOVIRAX) 200 MG capsule 800 mg (has no administration in time range)  HYDROcodone-acetaminophen (NORCO/VICODIN) 5-325 MG per tablet 1 tablet (has no administration in time range)  ibuprofen (ADVIL) tablet 600 mg (has no administration in time range)  ED Course/ Medical Decision Making/ A&P Clinical Course as of 06/02/23 1905  Sat Jun 02, 2023  1901 Rash on face c/w shingles. Patient is seen by Dr. Rubin Payor who agrees with diagnosis. The rash has continued to develop over 3 days, still new lesions yesterday. Will start acylcovir, provide pain management. Encourage PCP follow up to follow improvement.  [SU]    Clinical Course User Index [SU] Elpidio Anis, PA-C                                 Medical Decision Making Risk Prescription drug management.           Final Clinical Impression(s) / ED Diagnoses Final diagnoses:  Herpes zoster without complication    Rx / DC Orders ED Discharge Orders           Ordered    acyclovir (ZOVIRAX) 800 MG tablet  5 times daily        06/02/23 1904    HYDROcodone-acetaminophen (NORCO/VICODIN) 5-325 MG tablet  Every 4 hours PRN        06/02/23 1904    ibuprofen (ADVIL) 600 MG tablet  Every 6 hours PRN        06/02/23 1904              Elpidio Anis, Cordelia Poche 06/02/23 Thayer Headings, MD 06/03/23 1513
